# Patient Record
Sex: Male | Born: 1985 | Race: Black or African American | Hispanic: No | Marital: Single | Smoking: Current some day smoker
Health system: Southern US, Community
[De-identification: ages and names within clinical notes are randomized; demographics above are authoritative.]

---

## 2005-11-01 ENCOUNTER — Emergency Department: Payer: Self-pay | Admitting: Emergency Medicine

## 2008-03-26 ENCOUNTER — Emergency Department: Payer: Self-pay | Admitting: Emergency Medicine

## 2009-06-20 ENCOUNTER — Emergency Department: Payer: Self-pay | Admitting: Emergency Medicine
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
## 2011-08-24 ENCOUNTER — Emergency Department: Payer: Self-pay | Admitting: Emergency Medicine

## 2014-02-07 ENCOUNTER — Emergency Department: Payer: Self-pay | Admitting: Emergency Medicine

## 2014-02-10 ENCOUNTER — Emergency Department: Payer: Self-pay | Admitting: Emergency Medicine

## 2016-01-31 ENCOUNTER — Emergency Department
Admission: EM | Admit: 2016-01-31 | Discharge: 2016-01-31 | Disposition: A | Discharge status: Home / Self Care | Payer: Self-pay | Attending: Emergency Medicine | Admitting: Emergency Medicine

## 2016-01-31 ENCOUNTER — Encounter: Payer: Self-pay | Admitting: Emergency Medicine

## 2016-01-31 ENCOUNTER — Emergency Department: Payer: Self-pay

## 2016-01-31 DIAGNOSIS — Y998 Other external cause status: Secondary | ICD-10-CM | POA: Insufficient documentation

## 2016-01-31 DIAGNOSIS — Y9367 Activity, basketball: Secondary | ICD-10-CM | POA: Insufficient documentation

## 2016-01-31 DIAGNOSIS — F17210 Nicotine dependence, cigarettes, uncomplicated: Secondary | ICD-10-CM | POA: Insufficient documentation

## 2016-01-31 DIAGNOSIS — X58XXXA Exposure to other specified factors, initial encounter: Secondary | ICD-10-CM | POA: Insufficient documentation

## 2016-01-31 DIAGNOSIS — S63612A Unspecified sprain of right middle finger, initial encounter: Secondary | ICD-10-CM | POA: Insufficient documentation

## 2016-01-31 DIAGNOSIS — Y929 Unspecified place or not applicable: Secondary | ICD-10-CM | POA: Insufficient documentation

## 2016-01-31 DIAGNOSIS — S63619A Unspecified sprain of unspecified finger, initial encounter: Secondary | ICD-10-CM

## 2016-01-31 MED ORDER — IBUPROFEN 800 MG PO TABS: 800.0000 mg | ORAL_TABLET | Freq: Three times a day (TID) | ORAL | Status: AC | PRN

## 2016-01-31 NOTE — ED Provider Notes (Signed)
[facility] Emergency Department Provider Note  ____________________________________________  Time seen: Approximately 10:22 AM  I have reviewed the triage vital signs and the nursing notes.   HISTORY  Chief Complaint Hand Pain    HPI Christopher Holmes is a 30 y.o. male presents for evaluation of right middle finger pain. Patient states that he was playing basketball approximately one week ago his finger got tangled. Complains of continuous pain and swelling to the PIP joint. Describes pain as 8/10 nonradiating. No relief with over-the-counter medications.   History reviewed. No pertinent past medical history.  There are no active problems to display for this patient.   History reviewed. No pertinent past surgical history.  Current Outpatient Rx  Name  Route  Sig  Dispense  Refill  . ibuprofen (ADVIL,MOTRIN) 800 MG tablet   Oral   Take 1 tablet (800 mg total) by mouth every 8 (eight) hours as needed.   30 tablet   0     Allergies Review of patient's allergies indicates no known allergies.  No family history on file.  Social History Social History  Substance Use Topics  . Smoking status: Current Some Day Smoker    Types: Cigarettes  . Smokeless tobacco: Never Used  . Alcohol Use: Yes     Comment: occas    Review of Systems Constitutional: No fever/chills Musculoskeletal: Positive for right middle finger pain. Skin: Negative for rash. Neurological: Negative for headaches, focal weakness or numbness.  10-point ROS otherwise negative.  ____________________________________________   PHYSICAL EXAM: BP 119/75 mmHg  Pulse 63  Temp(Src) 98.2 F (36.8 C) (Oral)  Resp 20  Ht  (1.753 m)  Wt 81.647 kg  BMI 26.57 kg/m2  SpO2 98%   VITAL SIGNS: ED Triage Vitals  Enc Vitals Group     BP --      Pulse --      Resp --      Temp --      Temp src --      SpO2 --      Weight --      Height --      Head Cir --      Peak Flow --       Pain Score --      Pain Loc --      Pain Edu? --      Excl. in GC? --     Constitutional: Alert and oriented. Well appearing and in no acute distress. Musculoskeletal: Right third finger with increased swelling noted at the PIP joint. Distally neurovascularly intact with good capillary refill. Neurologic:  Normal speech and language. No gross focal neurologic deficits are appreciated. No gait instability. Skin:  Skin is warm, dry and intact. No rash noted. Psychiatric: Mood and affect are normal. Speech and behavior are normal.  ____________________________________________   LABS (all labs ordered are listed, but only abnormal results are displayed)  Labs Reviewed - No data to display ____________________________________________  EKG   ____________________________________________  RADIOLOGY  Negative for any acute osseous findings. ____________________________________________   PROCEDURES  Procedure(s) performed: None  Critical Care performed: No  ____________________________________________   INITIAL IMPRESSION / ASSESSMENT AND PLAN / ED COURSE  Pertinent labs & imaging results that were available during my care of the patient were reviewed by me and considered in my medical decision making (see chart for details).  Acute right third finger sprain. Rx given for ibuprofen 800 mg 3 times a day. Finger splint applied. Patient  follow-up with orthopedics if no relief In 14 days. ____________________________________________   FINAL CLINICAL IMPRESSION(S) / ED DIAGNOSES  Final diagnoses:  Finger sprain, initial encounter     This chart was dictated using voice recognition software/Dragon. Despite best efforts to proofread, errors can occur which can change the meaning. Any change was purely unintentional.   Evangeline Dakin, PA-C 01/31/16 1130  Jennye Moccasin, MD 01/31/16 1230

## 2016-01-31 NOTE — ED Notes (Addendum)
Pt reports he playing basketball last Saturday and his middle finger got tangled in shorts twisted. Continued pain and swelling to middle finger right hand

## 2016-01-31 NOTE — Discharge Instructions (Signed)
Finger Sprain A finger sprain is a tear in one of the strong, fibrous tissues that connect the bones (ligaments) in your finger. The severity of the sprain depends on how much of the ligament is torn. The tear can be either partial or complete. CAUSES  Often, sprains are a result of a fall or accident. If you extend your hands to catch an object or to protect yourself, the force of the impact causes the fibers of your ligament to stretch too much. This excess tension causes the fibers of your ligament to tear. SYMPTOMS  You may have some loss of motion in your finger. Other symptoms include:  Bruising.  Tenderness.  Swelling. DIAGNOSIS  In order to diagnose finger sprain, your caregiver will physically examine your finger or thumb to determine how torn the ligament is. Your caregiver may also suggest an X-ray exam of your finger to make sure no bones are broken. TREATMENT  If your ligament is only partially torn, treatment usually involves keeping the finger in a fixed position (immobilization) for a short period. To do this, your caregiver will apply a bandage, cast, or splint to keep your finger from moving until it heals. For a partially torn ligament, the healing process usually takes 2 to 3 weeks. If your ligament is completely torn, you may need surgery to reconnect the ligament to the bone. After surgery a cast or splint will be applied and will need to stay on your finger or thumb for 4 to 6 weeks while your ligament heals. HOME CARE INSTRUCTIONS  Keep your injured finger elevated, when possible, to decrease swelling.  To ease pain and swelling, apply ice to your joint twice a day, for 2 to 3 days:  Put ice in a plastic bag.  Place a towel between your skin and the bag.  Leave the ice on for 15 minutes.  Only take over-the-counter or prescription medicine for pain as directed by your caregiver.  Do not wear rings on your injured finger.  Do not leave your finger unprotected  until pain and stiffness go away (usually 3 to 4 weeks).  Do not allow your cast or splint to get wet. Cover your cast or splint with a plastic bag when you shower or bathe. Do not swim.  Your caregiver may suggest special exercises for you to do during your recovery to prevent or limit permanent stiffness. SEEK IMMEDIATE MEDICAL CARE IF:  Your cast or splint becomes damaged.  Your pain becomes worse rather than better. MAKE SURE YOU:  Understand these instructions.  Will watch your condition.  Will get help right away if you are not doing well or get worse.   This information is not intended to replace advice given to you by your health care provider. Make sure you discuss any questions you have with your health care provider.   Document Released: 08/05/2004 Document Revised: 07/19/2014 Document Reviewed: 03/01/2011 Elsevier Interactive Patient Education 2016 [employer]  Cryotherapy Cryotherapy means treatment with cold. Ice or gel packs can be used to reduce both pain and swelling. Ice is the most helpful within the first 24 to 48 hours after an injury or flare-up from overusing a muscle or joint. Sprains, strains, spasms, burning pain, shooting pain, and aches can all be eased with ice. Ice can also be used when recovering from surgery. Ice is effective, has very few side effects, and is safe for most people to use. PRECAUTIONS  Ice is not a safe treatment option for  people with:  Raynaud phenomenon. This is a condition affecting small blood vessels in the extremities. Exposure to cold may cause your problems to return.  Cold hypersensitivity. There are many forms of cold hypersensitivity, including:  Cold urticaria. Red, itchy hives appear on the skin when the tissues begin to warm after being iced.  Cold erythema. This is a red, itchy rash caused by exposure to cold.  Cold hemoglobinuria. Red blood cells break down when the tissues begin to warm after being iced. The  hemoglobin that carry oxygen are passed into the urine because they cannot combine with blood proteins fast enough.  Numbness or altered sensitivity in the area being iced. If you have any of the following conditions, do not use ice until you have discussed cryotherapy with your caregiver:  Heart conditions, such as arrhythmia, angina, or chronic heart disease.  High blood pressure.  Healing wounds or open skin in the area being iced.  Current infections.  Rheumatoid arthritis.  Poor circulation.  Diabetes. Ice slows the blood flow in the region it is applied. This is beneficial when trying to stop inflamed tissues from spreading irritating chemicals to surrounding tissues. However, if you expose your skin to cold temperatures for too long or without the proper protection, you can damage your skin or nerves. Watch for signs of skin damage due to cold. HOME CARE INSTRUCTIONS Follow these tips to use ice and cold packs safely.  Place a dry or damp towel between the ice and skin. A damp towel will cool the skin more quickly, so you may need to shorten the time that the ice is used.  For a more rapid response, add gentle compression to the ice.  Ice for no more than 10 to 20 minutes at a time. The bonier the area you are icing, the less time it will take to get the benefits of ice.  Check your skin after 5 minutes to make sure there are no signs of a poor response to cold or skin damage.  Rest 20 minutes or more between uses.  Once your skin is numb, you can end your treatment. You can test numbness by very lightly touching your skin. The touch should be so light that you do not see the skin dimple from the pressure of your fingertip. When using ice, most people will feel these normal sensations in this order: cold, burning, aching, and numbness.  Do not use ice on someone who cannot communicate their responses to pain, such as small children or people with dementia. HOW TO MAKE AN ICE  PACK Ice packs are the most common way to use ice therapy. Other methods include ice massage, ice baths, and cryosprays. Muscle creams that cause a cold, tingly feeling do not offer the same benefits that ice offers and should not be used as a substitute unless recommended by your caregiver. To make an ice pack, do one of the following:  Place crushed ice or a bag of frozen vegetables in a sealable plastic bag. Squeeze out the excess air. Place this bag inside another plastic bag. Slide the bag into a pillowcase or place a damp towel between your skin and the bag.  Mix 3 parts water with 1 part rubbing alcohol. Freeze the mixture in a sealable plastic bag. When you remove the mixture from the freezer, it will be slushy. Squeeze out the excess air. Place this bag inside another plastic bag. Slide the bag into a pillowcase or place a damp towel  between your skin and the bag. SEEK MEDICAL CARE IF:  You develop white spots on your skin. This may give the skin a blotchy (mottled) appearance.  Your skin turns blue or pale.  Your skin becomes waxy or hard.  Your swelling gets worse. MAKE SURE YOU:   Understand these instructions.  Will watch your condition.  Will get help right away if you are not doing well or get worse.   This information is not intended to replace advice given to you by your health care provider. Make sure you discuss any questions you have with your health care provider.   Document Released: 02/22/2011 Document Revised: 07/19/2014 Document Reviewed: 02/22/2011 Elsevier Interactive Patient Education [employer]

## 2018-02-23 IMAGING — DX DG FINGER MIDDLE 2+V*R*
3 series · 3 of 3 positions shown · non-contrast
Comparison: None.

CLINICAL DATA: Right middle finger injury last [REDACTED] while
playing basketball, continued pain and swelling at the PIP joint.

EXAM:
RIGHT MIDDLE FINGER 2+V

[finger ap]
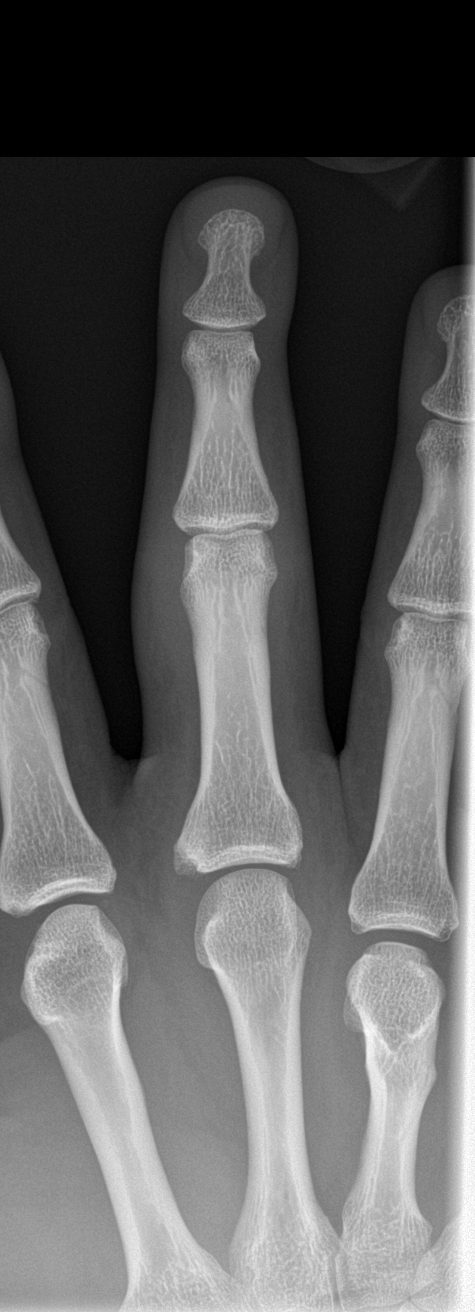

[finger obl]
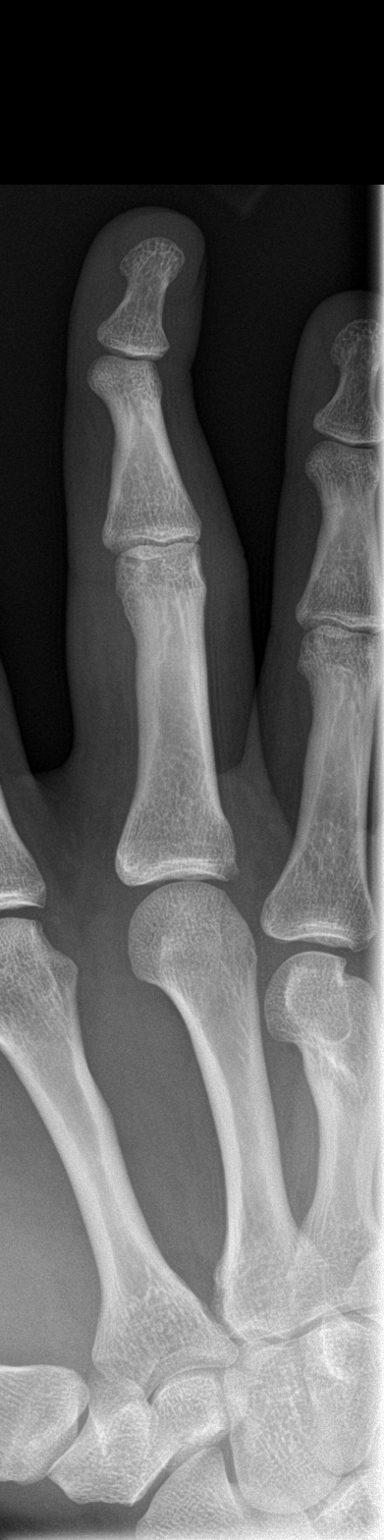

[finger lat]
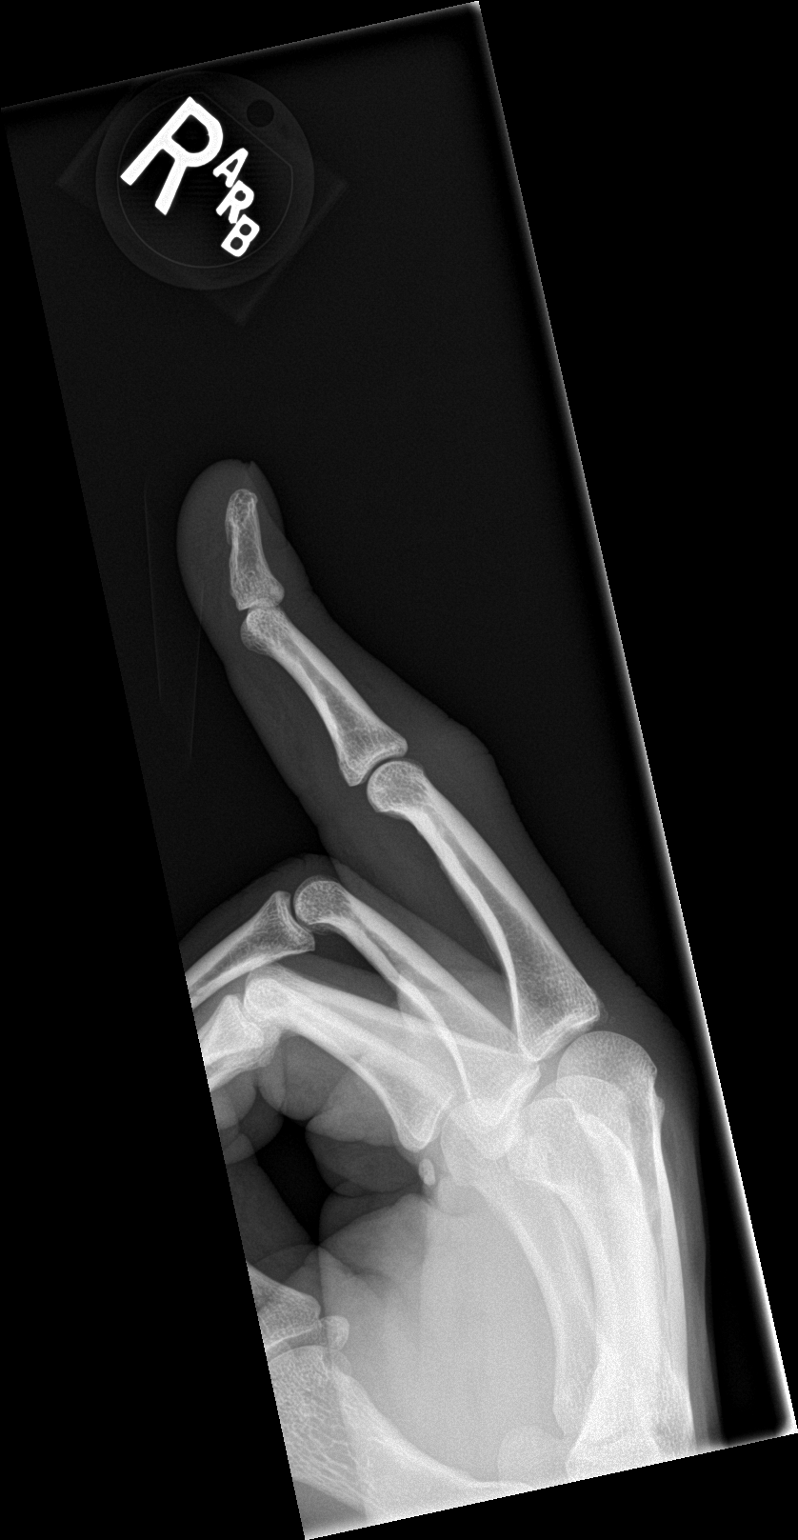

[3 of 3 positions shown; findings below may reference images not displayed]

FINDINGS: Osseous alignment is normal. No fracture line or displaced fracture
fragment identified. Adjacent soft tissues are unremarkable.
IMPRESSION: Negative.

## 2018-06-18 ENCOUNTER — Other Ambulatory Visit
Admission: AD | Admit: 2018-06-18 | Discharge: 2018-06-18 | Disposition: A | Discharge status: Home / Self Care | Attending: Family Medicine | Admitting: Family Medicine
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
